# Patient Record
Sex: Male | Born: 2005 | Race: Black or African American | Hispanic: No | Marital: Single | Smoking: Never smoker
Health system: Southern US, Community
[De-identification: ages and names within clinical notes are randomized; demographics above are authoritative.]

---
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
## 2024-03-04 ENCOUNTER — Encounter: Payer: Self-pay | Admitting: Family Medicine

## 2024-03-04 ENCOUNTER — Ambulatory Visit (INDEPENDENT_AMBULATORY_CARE_PROVIDER_SITE_OTHER): Payer: Self-pay | Admitting: Family Medicine

## 2024-03-04 VITALS — BP 114/75 | HR 64 | Ht 71.0 in | Wt 140.8 lb

## 2024-03-04 DIAGNOSIS — Z0000 Encounter for general adult medical examination without abnormal findings: Secondary | ICD-10-CM

## 2024-03-04 NOTE — Patient Instructions (Signed)
 Good to see you today - Thank you for coming in  Things we discussed today:  1) Your weight has decreased over the past year.  This could be due to changes in your diet. - Please let us know if you are having unintentional weight loss, blood in your stool, nausea, diarrhea.  This could be a sign of more serious health condition.  2) feel free to reach out to [place] if you have any other concerns about your health.   Come back to see me in 1 year for a physical

## 2024-03-04 NOTE — Progress Notes (Unsigned)
    SUBJECTIVE:   Chief compliant/HPI: annual examination  Olvin Rohr is a 18 y.o. who presents today for an annual exam.  - Working in [employer].  - No concerns today - Denies tobacco, alcohol, or other drug use - Denies mood symptoms  Reviewed and updated history.    OBJECTIVE:   BP 114/75   Pulse 64   Ht 5\' 11"  (1.803 m)   Wt 140 lb 12.8 oz (63.9 kg)   SpO2 100%   BMI 19.64 kg/m   General: Alert, pleasant well-appearing teen. NAD. HEENT: NCAT. MMM. CV: RRR, no murmurs.  Resp: CTAB, no wheezing or crackles. Normal WOB on RA.  Abm: Soft, nontender, nondistended. BS present. Ext: Moves all ext spontaneously Skin: Warm, well perfused   ASSESSMENT/PLAN:   Annual Examination  See AVS for age appropriate recommendations  PHQ score 2, reviewed and discussd.  Blood pressure reviewed and at goal.      Considered the following items based upon USPSTF recommendations: -declines STI testing  Follow up in 1 year or sooner if indicated.    Lincoln Brigham, MD [facility]
